# Patient Record
Sex: Female | Born: 1970
Health system: Southern US, Community
[De-identification: ages and names within clinical notes are randomized; demographics above are authoritative.]

## PROBLEM LIST (undated history)

## (undated) DIAGNOSIS — F32A Depression, unspecified: Secondary | ICD-10-CM

## (undated) DIAGNOSIS — Z5189 Encounter for other specified aftercare: Secondary | ICD-10-CM

## (undated) DIAGNOSIS — E282 Polycystic ovarian syndrome: Secondary | ICD-10-CM

## (undated) HISTORY — DX: Depression, unspecified: F32.A

## (undated) HISTORY — DX: Polycystic ovarian syndrome: E28.2

## (undated) HISTORY — PX: BREAST BIOPSY: SHX20

## (undated) HISTORY — DX: Encounter for other specified aftercare: Z51.89

## (undated) HISTORY — PX: POLYPECTOMY: SHX149

## (undated) HISTORY — PX: COLONOSCOPY: SHX174

## (undated) HISTORY — PX: OTHER SURGICAL HISTORY: SHX169

---

## 2001-03-05 ENCOUNTER — Other Ambulatory Visit: Admission: RE | Admit: 2001-03-05 | Discharge: 2001-03-05 | Payer: Self-pay | Admitting: Obstetrics and Gynecology

## 2002-04-05 ENCOUNTER — Encounter (INDEPENDENT_AMBULATORY_CARE_PROVIDER_SITE_OTHER): Payer: Self-pay | Admitting: Specialist

## 2002-04-05 ENCOUNTER — Encounter: Payer: Self-pay | Admitting: Family Medicine

## 2002-04-05 ENCOUNTER — Encounter: Admission: RE | Admit: 2002-04-05 | Discharge: 2002-04-05 | Payer: Self-pay | Admitting: Family Medicine

## 2003-10-27 ENCOUNTER — Ambulatory Visit (HOSPITAL_COMMUNITY): Admission: RE | Admit: 2003-10-27 | Discharge: 2003-10-27 | Payer: Self-pay | Admitting: Family Medicine

## 2008-03-13 ENCOUNTER — Other Ambulatory Visit: Admission: RE | Admit: 2008-03-13 | Discharge: 2008-03-13 | Payer: Self-pay | Admitting: Family Medicine

## 2011-12-19 ENCOUNTER — Other Ambulatory Visit: Payer: Self-pay | Admitting: Family Medicine

## 2011-12-19 DIAGNOSIS — Z1231 Encounter for screening mammogram for malignant neoplasm of breast: Secondary | ICD-10-CM

## 2012-01-28 ENCOUNTER — Ambulatory Visit: Payer: Self-pay

## 2012-03-04 ENCOUNTER — Ambulatory Visit
Admission: RE | Admit: 2012-03-04 | Discharge: 2012-03-04 | Disposition: A | Discharge status: Home / Self Care | Payer: Commercial Managed Care - PPO | Source: Ambulatory Visit | Attending: Family Medicine | Admitting: Family Medicine

## 2012-03-04 DIAGNOSIS — Z1231 Encounter for screening mammogram for malignant neoplasm of breast: Secondary | ICD-10-CM

## 2012-03-08 ENCOUNTER — Other Ambulatory Visit: Payer: Self-pay | Admitting: Family Medicine

## 2012-03-08 DIAGNOSIS — R928 Other abnormal and inconclusive findings on diagnostic imaging of breast: Secondary | ICD-10-CM

## 2012-04-01 ENCOUNTER — Other Ambulatory Visit: Payer: Self-pay | Admitting: Family Medicine

## 2012-04-01 ENCOUNTER — Other Ambulatory Visit (HOSPITAL_COMMUNITY)
Admission: RE | Admit: 2012-04-01 | Discharge: 2012-04-01 | Disposition: A | Discharge status: Home / Self Care | Payer: Commercial Managed Care - PPO | Source: Ambulatory Visit | Attending: Family Medicine | Admitting: Family Medicine

## 2012-04-01 DIAGNOSIS — Z124 Encounter for screening for malignant neoplasm of cervix: Secondary | ICD-10-CM | POA: Insufficient documentation

## 2012-04-01 DIAGNOSIS — Z1151 Encounter for screening for human papillomavirus (HPV): Secondary | ICD-10-CM | POA: Insufficient documentation

## 2012-04-08 ENCOUNTER — Ambulatory Visit
Admission: RE | Admit: 2012-04-08 | Discharge: 2012-04-08 | Disposition: A | Discharge status: Home / Self Care | Payer: Commercial Managed Care - PPO | Source: Ambulatory Visit | Attending: Family Medicine | Admitting: Family Medicine

## 2012-04-08 DIAGNOSIS — R928 Other abnormal and inconclusive findings on diagnostic imaging of breast: Secondary | ICD-10-CM

## 2012-09-06 ENCOUNTER — Other Ambulatory Visit: Payer: Self-pay | Admitting: Family Medicine

## 2012-09-06 DIAGNOSIS — R921 Mammographic calcification found on diagnostic imaging of breast: Secondary | ICD-10-CM

## 2012-11-09 ENCOUNTER — Ambulatory Visit
Admission: RE | Admit: 2012-11-09 | Discharge: 2012-11-09 | Disposition: A | Discharge status: Home / Self Care | Payer: Self-pay | Source: Ambulatory Visit | Attending: Family Medicine | Admitting: Family Medicine

## 2012-11-09 DIAGNOSIS — R921 Mammographic calcification found on diagnostic imaging of breast: Secondary | ICD-10-CM

## 2012-11-23 ENCOUNTER — Other Ambulatory Visit: Payer: Self-pay | Admitting: Family Medicine

## 2012-11-23 DIAGNOSIS — R921 Mammographic calcification found on diagnostic imaging of breast: Secondary | ICD-10-CM

## 2013-05-20 ENCOUNTER — Ambulatory Visit
Admission: RE | Admit: 2013-05-20 | Discharge: 2013-05-20 | Disposition: A | Discharge status: Home / Self Care | Payer: Self-pay | Source: Ambulatory Visit | Attending: Family Medicine | Admitting: Family Medicine

## 2013-05-20 DIAGNOSIS — R921 Mammographic calcification found on diagnostic imaging of breast: Secondary | ICD-10-CM

## 2014-04-17 ENCOUNTER — Other Ambulatory Visit: Payer: Self-pay

## 2014-04-17 DIAGNOSIS — Z1231 Encounter for screening mammogram for malignant neoplasm of breast: Secondary | ICD-10-CM

## 2014-05-23 ENCOUNTER — Other Ambulatory Visit: Payer: Self-pay | Admitting: Family Medicine

## 2014-05-23 DIAGNOSIS — R921 Mammographic calcification found on diagnostic imaging of breast: Secondary | ICD-10-CM

## 2014-05-26 ENCOUNTER — Ambulatory Visit
Admission: RE | Admit: 2014-05-26 | Discharge: 2014-05-26 | Disposition: A | Discharge status: Home / Self Care | Payer: BLUE CROSS/BLUE SHIELD | Source: Ambulatory Visit

## 2014-05-26 DIAGNOSIS — R921 Mammographic calcification found on diagnostic imaging of breast: Secondary | ICD-10-CM

## 2015-04-23 ENCOUNTER — Other Ambulatory Visit: Payer: Self-pay

## 2015-04-23 DIAGNOSIS — Z1231 Encounter for screening mammogram for malignant neoplasm of breast: Secondary | ICD-10-CM

## 2015-05-28 ENCOUNTER — Ambulatory Visit
Admission: RE | Admit: 2015-05-28 | Discharge: 2015-05-28 | Disposition: A | Discharge status: Home / Self Care | Payer: BLUE CROSS/BLUE SHIELD | Source: Ambulatory Visit

## 2015-05-28 ENCOUNTER — Ambulatory Visit: Payer: BLUE CROSS/BLUE SHIELD

## 2015-05-28 DIAGNOSIS — Z1231 Encounter for screening mammogram for malignant neoplasm of breast: Secondary | ICD-10-CM

## 2015-06-04 ENCOUNTER — Other Ambulatory Visit: Payer: Self-pay

## 2015-06-04 DIAGNOSIS — Z1231 Encounter for screening mammogram for malignant neoplasm of breast: Secondary | ICD-10-CM

## 2016-05-28 ENCOUNTER — Ambulatory Visit: Payer: BLUE CROSS/BLUE SHIELD

## 2016-06-25 ENCOUNTER — Ambulatory Visit: Payer: BLUE CROSS/BLUE SHIELD

## 2016-07-17 ENCOUNTER — Ambulatory Visit: Payer: BLUE CROSS/BLUE SHIELD

## 2016-07-18 ENCOUNTER — Ambulatory Visit
Admission: RE | Admit: 2016-07-18 | Discharge: 2016-07-18 | Disposition: A | Discharge status: Home / Self Care | Payer: BLUE CROSS/BLUE SHIELD | Source: Ambulatory Visit

## 2016-07-18 DIAGNOSIS — Z1231 Encounter for screening mammogram for malignant neoplasm of breast: Secondary | ICD-10-CM

## 2016-10-01 ENCOUNTER — Other Ambulatory Visit (HOSPITAL_COMMUNITY)
Admission: RE | Admit: 2016-10-01 | Discharge: 2016-10-01 | Disposition: A | Discharge status: Home / Self Care | Payer: BLUE CROSS/BLUE SHIELD | Source: Ambulatory Visit | Attending: Family Medicine | Admitting: Family Medicine

## 2016-10-01 ENCOUNTER — Other Ambulatory Visit: Payer: Self-pay | Admitting: Family Medicine

## 2016-10-01 DIAGNOSIS — Z124 Encounter for screening for malignant neoplasm of cervix: Secondary | ICD-10-CM | POA: Insufficient documentation

## 2016-10-02 LAB — CYTOLOGY - PAP
Adequacy: ABSENT
Diagnosis: NEGATIVE
HPV: NOT DETECTED

## 2017-04-08 ENCOUNTER — Ambulatory Visit (INDEPENDENT_AMBULATORY_CARE_PROVIDER_SITE_OTHER): Payer: BLUE CROSS/BLUE SHIELD | Admitting: Orthopedic Surgery

## 2017-04-08 ENCOUNTER — Encounter (INDEPENDENT_AMBULATORY_CARE_PROVIDER_SITE_OTHER): Payer: Self-pay | Admitting: Orthopedic Surgery

## 2017-04-08 DIAGNOSIS — M542 Cervicalgia: Secondary | ICD-10-CM | POA: Diagnosis not present

## 2017-04-08 NOTE — Progress Notes (Signed)
   Office Visit Note   Patient: Desiree Bennett           Date of Birth: 10/19/1970           MRN: [id number] Visit Date: 04/08/2017 Requested by: No referring provider defined for this encounter. PCP: Laurann MontanaWhite, Cynthia, MD  Subjective: Chief Complaint  Patient presents with  . neck    HPI: Desiree GrossMarjorie is a patient with neck and shoulder issues on the right-hand side.  This started Monday, 03/30/2017.  Symptoms started and were noticed when she was walking.  Basically she was listing to the right.  Her mother noticed that she was tilted and that her shoulders were "off".  She was taking a medication called Lamictal which she has since stopped and her symptoms have improved.  She denies any numbness numbness tingling or neck pain or spasm or any other type of TIA symptoms.  She denies any other medical problems.              ROS: All systems reviewed are negative as they relate to the chief complaint within the history of present illness.  Patient denies  fevers or chills.   Assessment & Plan: Visit Diagnoses:  1. Cervicalgia     Plan: Impression is atypical neck and shoulder spasm.  I think this could be a reaction to the Lamictal.  Alternatively this could represent some type of CNS process such as a TIA which is affecting her balance.  She is improving off the Lamictal.  If she is still listing by Monday I recommend that she see either her primary care provider or a neurologist  Follow-Up Instructions: Return if symptoms worsen or fail to improve.   Orders:  No orders of the defined types were placed in this encounter.  No orders of the defined types were placed in this encounter.     Procedures: No procedures performed   Clinical Data: No additional findings.  Objective: Vital Signs: There were no vitals taken for this visit.  Physical Exam:   Constitutional: Patient appears well-developed HEENT:  Head: Normocephalic Eyes:EOM are normal Neck: Normal range of  motion Cardiovascular: Normal rate Pulmonary/chest: Effort normal Neurologic: Patient is alert Skin: Skin is warm Psychiatric: Patient has normal mood and affect    Ortho Exam: Orthopedic exam demonstrates good cervical spine range of motion.  5 out of 5 grip EPL FPL interosseous wrist flexion extension biceps triceps and deltoid strength.  Patient has palpable radial pulses.  No paresthesias C5-T1.  No scoliosis on forward bending.  No real pain with neck range of motion.  Shoulder exam is normal bilaterally.  No trapezial spasm.  Patient is tilting a little bit to the right when she stands and walks.  Again this is subtle but I think it is present.  Specialty Comments:  No specialty comments available.  Imaging: No results found.   PMFS History: There are no active problems to display for this patient.  History reviewed. No pertinent past medical history.  History reviewed. No pertinent family history.  Past Surgical History:  Procedure Laterality Date  . BREAST BIOPSY     Social History   Occupational History  . Not on file  Tobacco Use  . Smoking status: Not on file  Substance and Sexual Activity  . Alcohol use: Not on file  . Drug use: Not on file  . Sexual activity: Not on file

## 2017-05-20 ENCOUNTER — Other Ambulatory Visit: Payer: Self-pay | Admitting: Family Medicine

## 2017-05-20 ENCOUNTER — Ambulatory Visit
Admission: RE | Admit: 2017-05-20 | Discharge: 2017-05-20 | Disposition: A | Discharge status: Home / Self Care | Payer: BLUE CROSS/BLUE SHIELD | Source: Ambulatory Visit | Attending: Family Medicine | Admitting: Family Medicine

## 2017-05-20 DIAGNOSIS — M4120 Other idiopathic scoliosis, site unspecified: Secondary | ICD-10-CM

## 2017-05-28 ENCOUNTER — Other Ambulatory Visit: Payer: Self-pay | Admitting: Family Medicine

## 2017-05-28 DIAGNOSIS — N912 Amenorrhea, unspecified: Secondary | ICD-10-CM

## 2017-05-28 DIAGNOSIS — E282 Polycystic ovarian syndrome: Secondary | ICD-10-CM

## 2017-06-10 ENCOUNTER — Other Ambulatory Visit: Payer: Self-pay | Admitting: Family Medicine

## 2017-06-10 DIAGNOSIS — Z1231 Encounter for screening mammogram for malignant neoplasm of breast: Secondary | ICD-10-CM

## 2017-07-20 ENCOUNTER — Ambulatory Visit
Admission: RE | Admit: 2017-07-20 | Discharge: 2017-07-20 | Disposition: A | Discharge status: Home / Self Care | Payer: BLUE CROSS/BLUE SHIELD | Source: Ambulatory Visit | Attending: Family Medicine | Admitting: Family Medicine

## 2017-07-20 DIAGNOSIS — Z1231 Encounter for screening mammogram for malignant neoplasm of breast: Secondary | ICD-10-CM

## 2019-04-22 ENCOUNTER — Other Ambulatory Visit: Payer: Self-pay | Admitting: Family Medicine

## 2019-04-22 DIAGNOSIS — E559 Vitamin D deficiency, unspecified: Secondary | ICD-10-CM

## 2019-05-17 ENCOUNTER — Other Ambulatory Visit: Payer: Self-pay

## 2019-05-17 ENCOUNTER — Ambulatory Visit
Admission: RE | Admit: 2019-05-17 | Discharge: 2019-05-17 | Disposition: A | Discharge status: Home / Self Care | Payer: BLUE CROSS/BLUE SHIELD | Source: Ambulatory Visit | Attending: Family Medicine | Admitting: Family Medicine

## 2019-05-17 DIAGNOSIS — E559 Vitamin D deficiency, unspecified: Secondary | ICD-10-CM

## 2019-08-17 DIAGNOSIS — Z86010 Personal history of colonic polyps: Secondary | ICD-10-CM

## 2019-08-17 DIAGNOSIS — Z860101 Personal history of adenomatous and serrated colon polyps: Secondary | ICD-10-CM

## 2019-08-17 HISTORY — DX: Personal history of colonic polyps: Z86.010

## 2019-08-17 HISTORY — DX: Personal history of adenomatous and serrated colon polyps: Z86.0101

## 2020-03-06 ENCOUNTER — Ambulatory Visit: Payer: BLUE CROSS/BLUE SHIELD | Admitting: Family Medicine

## 2020-03-27 ENCOUNTER — Other Ambulatory Visit: Payer: Self-pay | Admitting: Family Medicine

## 2020-03-27 DIAGNOSIS — Z1231 Encounter for screening mammogram for malignant neoplasm of breast: Secondary | ICD-10-CM

## 2020-04-12 ENCOUNTER — Telehealth: Payer: Self-pay | Admitting: Family Medicine

## 2020-04-12 NOTE — Telephone Encounter (Signed)
Please advise 

## 2020-04-12 NOTE — Telephone Encounter (Signed)
Appt moved up, Thank you!

## 2020-04-12 NOTE — Telephone Encounter (Signed)
Pt had a appt on 04/13/20 @ 2:40 but she was a new pt establishing PCP so I had to move her but there wasn't an appt until 05/01/20. Dr. Prince Rome had a slot open @ 1:20 on 04/25/20 and I was just wondering if the pt could be squeezed in that day or will she have to wait until the 22nd?

## 2020-04-12 NOTE — Telephone Encounter (Signed)
That would be ok.

## 2020-04-13 ENCOUNTER — Ambulatory Visit: Payer: BLUE CROSS/BLUE SHIELD | Admitting: Family Medicine

## 2020-04-25 ENCOUNTER — Ambulatory Visit: Payer: BLUE CROSS/BLUE SHIELD | Admitting: Family Medicine

## 2020-04-25 ENCOUNTER — Ambulatory Visit (INDEPENDENT_AMBULATORY_CARE_PROVIDER_SITE_OTHER): Payer: BLUE CROSS/BLUE SHIELD | Admitting: Family Medicine

## 2020-04-25 ENCOUNTER — Encounter: Payer: Self-pay | Admitting: Family Medicine

## 2020-04-25 ENCOUNTER — Other Ambulatory Visit: Payer: Self-pay

## 2020-04-25 VITALS — BP 96/63 | HR 66 | Ht 63.0 in | Wt 113.2 lb

## 2020-04-25 DIAGNOSIS — F319 Bipolar disorder, unspecified: Secondary | ICD-10-CM | POA: Insufficient documentation

## 2020-04-25 DIAGNOSIS — E282 Polycystic ovarian syndrome: Secondary | ICD-10-CM

## 2020-04-25 DIAGNOSIS — R944 Abnormal results of kidney function studies: Secondary | ICD-10-CM

## 2020-04-25 DIAGNOSIS — F3176 Bipolar disorder, in full remission, most recent episode depressed: Secondary | ICD-10-CM

## 2020-04-25 DIAGNOSIS — Z7689 Persons encountering health services in other specified circumstances: Secondary | ICD-10-CM

## 2020-04-25 MED ORDER — METFORMIN HCL ER 500 MG PO TB24: 2000.0000 mg | ORAL_TABLET | Freq: Every day | ORAL | 3 refills | Status: AC

## 2020-04-25 NOTE — Progress Notes (Signed)
Office Visit Note   Patient: Desiree Bennett           Date of Birth: [birth year]           MRN: [id number] Visit Date: 04/25/2020 Requested by: Laurann Montana, MD [phone] [address] [place],  [place] [address] PCP: Lavada Mesi, MD  Subjective: Chief Complaint  Patient presents with  . Other    Establish primary care    HPI: She is here to establish care.  Her parents are patients of mine.  Today she is feeling well, no concerns or complaints.  She has a history of bipolar disorder with depression, managed by Dr. Evelene Croon.  She is doing very well on her current regimen of Zoloft, Zyprexa, lithium, Lamictal, Depakote and levothyroxine.  She takes levothyroxine for depression, not for thyroid dysfunction.  She has not had any side effects with her medications.  She takes Metformin for polycystic ovary syndrome.  She states that she recently had labs by Dr. Cliffton Asters which showed decreased GFR.  She is supposed to have that rechecked in May.  She is up-to-date on gynecologic exams, she has a mammogram scheduled for next month.  She had a colonoscopy a couple years ago which showed some polyps.  She is supposed to have another one next year.  She is up-to-date on eye exams, dental exams.  She has never had any skin problems.               ROS:   All other systems were reviewed and are negative.  Objective: Vital Signs: BP 96/63   Pulse 66   Ht 5\' 3"  (1.6 m)   Wt 113 lb 3.2 oz (51.3 kg)   BMI 20.05 kg/m   Physical Exam:  General:  Alert and oriented, in no acute distress. Pulm:  Breathing unlabored. Psy:  Normal mood, congruent affect. Skin: No suspicious lesions seen HEENT:  [place]/AT, PERRLA, EOM Full, no nystagmus.  Funduscopic examination within normal limits.  No conjunctival erythema.  Tympanic membranes are pearly gray with normal landmarks.  External ear canals are normal.  Nasal passages are clear.  Oropharynx is clear.  No significant lymphadenopathy.  No  thyromegaly or nodules.  2+ carotid pulses without bruits. CV: Regular rate and rhythm without murmurs, rubs, or gallops.  No peripheral edema.  2+ radial and posterior tibial pulses. Lungs: Clear to auscultation throughout with no wheezing or areas of consolidation. Abd: Bowel sounds are active, no hepatosplenomegaly or masses.  Soft and nontender.  No audible bruits.  No evidence of ascites.   Imaging: No results found.  Assessment & Plan: 1.  Visit to establish care -We will see her back in a year for wellness exam.  2.  Bipolar disorder, currently doing well.  Managed by psychiatry.  3.  PCOS -Refilled Metformin.  4.  Decreased GFR -Recheck in May.     Procedures: No procedures performed        PMFS History: Patient Active Problem List   Diagnosis Date Noted  . Bipolar disorder (HCC) 04/25/2020  . PCOS (polycystic ovarian syndrome) 04/25/2020   History reviewed. No pertinent past medical history.  Family History  Problem Relation Age of Onset  . Depression Sister   . Cancer Paternal Aunt   . Deep vein thrombosis Paternal Grandmother   . COPD Paternal Grandfather   . Diabetes Paternal Aunt   . Heart attack Neg Hx   . Stroke Neg Hx     Past Surgical  History:  Procedure Laterality Date  . BREAST BIOPSY Left    Social History   Occupational History  . Not on file  Tobacco Use  . Smoking status: Never Smoker  . Smokeless tobacco: Never Used  Substance and Sexual Activity  . Alcohol use: Not on file  . Drug use: Not on file  . Sexual activity: Not on file

## 2020-05-01 ENCOUNTER — Ambulatory Visit: Payer: BLUE CROSS/BLUE SHIELD | Admitting: Family Medicine

## 2020-05-03 ENCOUNTER — Telehealth: Payer: Self-pay | Admitting: Family Medicine

## 2020-05-03 NOTE — Telephone Encounter (Signed)
I called and left this information on the patient's voice mail.   

## 2020-05-03 NOTE — Telephone Encounter (Signed)
Pt called stating she has some lab work coming up and wanted to know if the lab work would be for her kidneys? Pt also stated she is taking metformin and wanted to know if she has to take it with food and does she have to take it at a certain time? Pt would like a CB with answers  [phone]

## 2020-05-03 NOTE — Telephone Encounter (Signed)
It does not have to be with food, and the time of day probably doesn't matter either.

## 2020-05-03 NOTE — Telephone Encounter (Signed)
I know the answer to the first part is yes. Please advise on the metformin.

## 2020-05-10 ENCOUNTER — Ambulatory Visit: Payer: BLUE CROSS/BLUE SHIELD | Admitting: Family Medicine

## 2020-05-16 ENCOUNTER — Telehealth: Payer: Self-pay | Admitting: Family Medicine

## 2020-05-16 NOTE — Telephone Encounter (Signed)
Pt called wanting to know if she should get a mammogram? Pt states her last one was in April of 2021;she would like a CB to be advised.   [phone]

## 2020-05-16 NOTE — Telephone Encounter (Signed)
I called and reached the patient's voice mail - it looks like the patient cancelled her mammogram for 05/18/20. Dr. Prince Rome ordered it back in February. Asked her to call me back or send a MyChart message.

## 2020-05-17 ENCOUNTER — Telehealth: Payer: Self-pay | Admitting: Family Medicine

## 2020-05-17 NOTE — Telephone Encounter (Signed)
I sent a message to the patient through MyChart - please refer to that message.

## 2020-05-17 NOTE — Telephone Encounter (Signed)
Patient needs a call back. Patient states that Dr. Donzetta Sprung is her PCP and was she can schedule her yearly mammogram. Also need referral for mammogram under [facility]. Please call patient at [phone]. Patient states she does not want to go to [facility].

## 2020-05-17 NOTE — Telephone Encounter (Signed)
I messaged you about this patient earlier. She originally had a mammogram scheduled for 05/18/20, but she cancelled it because she wanted a [facility] facility - she does not want [facility].

## 2020-05-18 ENCOUNTER — Ambulatory Visit: Payer: BLUE CROSS/BLUE SHIELD

## 2020-05-30 NOTE — Telephone Encounter (Signed)
[facility]

## 2020-06-08 ENCOUNTER — Ambulatory Visit (INDEPENDENT_AMBULATORY_CARE_PROVIDER_SITE_OTHER): Payer: BLUE CROSS/BLUE SHIELD | Admitting: Family Medicine

## 2020-06-08 ENCOUNTER — Encounter: Payer: Self-pay | Admitting: Family Medicine

## 2020-06-08 ENCOUNTER — Other Ambulatory Visit: Payer: Self-pay

## 2020-06-08 VITALS — BP 99/62 | HR 76 | Ht 63.0 in | Wt 116.4 lb

## 2020-06-08 DIAGNOSIS — N912 Amenorrhea, unspecified: Secondary | ICD-10-CM | POA: Insufficient documentation

## 2020-06-08 DIAGNOSIS — H9190 Unspecified hearing loss, unspecified ear: Secondary | ICD-10-CM | POA: Insufficient documentation

## 2020-06-08 DIAGNOSIS — Z86010 Personal history of colonic polyps: Secondary | ICD-10-CM

## 2020-06-08 DIAGNOSIS — E039 Hypothyroidism, unspecified: Secondary | ICD-10-CM | POA: Insufficient documentation

## 2020-06-08 DIAGNOSIS — M4120 Other idiopathic scoliosis, site unspecified: Secondary | ICD-10-CM | POA: Insufficient documentation

## 2020-06-08 NOTE — Progress Notes (Signed)
   Office Visit Note   Patient: Desiree Bennett           Date of Birth: 05/15/1970           MRN: [id number] Visit Date: 06/08/2020 Requested by: Lavada Mesi, MD [address] [place],  [place] [address] PCP: Lavada Mesi, MD  Subjective: Chief Complaint  Patient presents with  . Other    Questions about having a colonoscopy this year. She brought the results from last year, that was done through [facility] -- wants to stay in the [facility] system. Also, she has some covid questions.    HPI: She is here to discuss GI referral.  Last year she had colonoscopy showing multiple polyps.  She was told she needed another one in 1 year.  This was at [facility].  She would like to transfer to the [facility] system.  She also had covid-related questions.  She has been vaccinated and boosted.  No covid illness so far.               ROS:   All other systems were reviewed and are negative.  Objective: Vital Signs: BP 99/62   Pulse 76   Ht 5\' 3"  (1.6 m)   Wt 116 lb 6.4 oz (52.8 kg)   BMI 20.62 kg/m   Physical Exam:  General:  Alert and oriented, in no acute distress. Pulm:  Breathing unlabored. Psy:  Normal mood, congruent affect.  No exam done.  Imaging: No results found.  Assessment & Plan: 1.  History of colon polyps. - Referral to Dr. .       Procedures: No procedures performed        PMFS History: Patient Active Problem List   Diagnosis Date Noted  . Amenorrhea 06/08/2020  . Hearing loss 06/08/2020  . Hypothyroidism 06/08/2020  . Other idiopathic scoliosis, site unspecified 06/08/2020  . Bipolar disorder (HCC) 04/25/2020  . PCOS (polycystic ovarian syndrome) 04/25/2020   No past medical history on file.  Family History  Problem Relation Age of Onset  . Depression Sister   . Cancer Paternal Aunt   . Deep vein thrombosis Paternal Grandmother   . COPD Paternal Grandfather   . Diabetes Paternal Aunt   . Heart attack Neg Hx   . Stroke Neg Hx     Past Surgical  History:  Procedure Laterality Date  . BREAST BIOPSY Left    Social History   Occupational History  . Not on file  Tobacco Use  . Smoking status: Never Smoker  . Smokeless tobacco: Never Used  Substance and Sexual Activity  . Alcohol use: Not on file  . Drug use: Not on file  . Sexual activity: Not on file

## 2020-06-13 ENCOUNTER — Telehealth: Payer: Self-pay | Admitting: Internal Medicine

## 2020-06-13 DIAGNOSIS — Z86010 Personal history of colonic polyps: Secondary | ICD-10-CM

## 2020-06-13 NOTE — Telephone Encounter (Signed)
Good afternoon Dr. Leone Payor, we received a referral for patient to have a colonoscopy.  She had one 08/17/19 with [facility] but would like to transfer to [facility].  Records will be sent to you.  Can you please review and advise on scheduling?  Thank you.

## 2020-06-14 ENCOUNTER — Encounter: Payer: Self-pay | Admitting: Internal Medicine

## 2020-06-14 NOTE — Telephone Encounter (Signed)
Patient had colonoscopy July 2021 he had 2 adenomas removed that were some centimeter but had a fair prep so a colonoscopy was appropriately recommended at 1 year.  Please schedule direct colonoscopy.  Patient may need double prep.

## 2020-06-14 NOTE — Telephone Encounter (Signed)
Called patient and scheduled her for 08/09/20 and 08/31/20.

## 2020-06-25 ENCOUNTER — Ambulatory Visit (INDEPENDENT_AMBULATORY_CARE_PROVIDER_SITE_OTHER): Payer: BLUE CROSS/BLUE SHIELD

## 2020-06-25 ENCOUNTER — Other Ambulatory Visit: Payer: Self-pay

## 2020-06-25 ENCOUNTER — Telehealth: Payer: Self-pay

## 2020-06-25 ENCOUNTER — Encounter: Payer: Self-pay | Admitting: Family Medicine

## 2020-06-25 DIAGNOSIS — R944 Abnormal results of kidney function studies: Secondary | ICD-10-CM

## 2020-06-25 NOTE — Telephone Encounter (Signed)
It's a BMP (orders are in chart), mainly to check kidney function.  So no fasting needed.

## 2020-06-25 NOTE — Telephone Encounter (Signed)
Noted  

## 2020-06-25 NOTE — Telephone Encounter (Signed)
Pt would like to know if she needs to fast before blood work.

## 2020-06-25 NOTE — Telephone Encounter (Signed)
Please advise. Also what labs are we drawing?

## 2020-06-25 NOTE — Progress Notes (Signed)
Patient came in today for a nurse visit. We drew some blood work.

## 2020-06-26 ENCOUNTER — Telehealth: Payer: Self-pay | Admitting: Family Medicine

## 2020-06-26 LAB — BASIC METABOLIC PANEL
BUN: 19 mg/dL (ref 7–25)
CO2: 31 mmol/L (ref 20–32)
Calcium: 10.2 mg/dL (ref 8.6–10.4)
Chloride: 107 mmol/L (ref 98–110)
Creat: 1 mg/dL (ref 0.50–1.05)
Glucose, Bld: 96 mg/dL (ref 65–99)
Potassium: 5 mmol/L (ref 3.5–5.3)
Sodium: 145 mmol/L (ref 135–146)

## 2020-06-26 LAB — EXTRA LAV TOP TUBE

## 2020-06-26 NOTE — Telephone Encounter (Signed)
Kidney function/creatinine looks great.

## 2020-07-11 ENCOUNTER — Ambulatory Visit
Admission: RE | Admit: 2020-07-11 | Discharge: 2020-07-11 | Disposition: A | Discharge status: Home / Self Care | Payer: BLUE CROSS/BLUE SHIELD | Source: Ambulatory Visit | Attending: Family Medicine | Admitting: Family Medicine

## 2020-07-11 ENCOUNTER — Other Ambulatory Visit: Payer: Self-pay | Admitting: Family Medicine

## 2020-07-11 ENCOUNTER — Other Ambulatory Visit: Payer: Self-pay

## 2020-07-11 DIAGNOSIS — Z1231 Encounter for screening mammogram for malignant neoplasm of breast: Secondary | ICD-10-CM

## 2020-07-16 ENCOUNTER — Telehealth: Payer: Self-pay | Admitting: Family Medicine

## 2020-07-16 NOTE — Telephone Encounter (Signed)
Mammogram is negative/normal.   

## 2020-08-09 ENCOUNTER — Telehealth: Payer: Self-pay | Admitting: Internal Medicine

## 2020-08-09 ENCOUNTER — Ambulatory Visit (AMBULATORY_SURGERY_CENTER): Payer: BLUE CROSS/BLUE SHIELD | Admitting: *Deleted

## 2020-08-09 ENCOUNTER — Other Ambulatory Visit: Payer: Self-pay

## 2020-08-09 VITALS — Ht 63.0 in | Wt 114.0 lb

## 2020-08-09 DIAGNOSIS — Z86010 Personal history of colonic polyps: Secondary | ICD-10-CM

## 2020-08-09 MED ORDER — PEG-KCL-NACL-NASULF-NA ASC-C 100 G PO SOLR: 1.0000 | Freq: Once | ORAL | 0 refills | Status: AC

## 2020-08-09 NOTE — Telephone Encounter (Signed)
Pt states Movi is $88-  she wants a different  Less expensive pre- Changes her to @ DAyt Miralax- we discussed new prep instructions over the phone today , mailed hte new instructions and sent in her My Chart

## 2020-08-09 NOTE — Progress Notes (Addendum)
No egg or soy allergy known to patient  No issues with past sedation with any surgeries or procedures Patient denies ever being told they had issues or difficulty with intubation  No FH of Malignant Hyperthermia No diet pills per patient No home 02 use per patient  No blood thinners per patient  Pt denies issues with constipation  No A fib or A flutter  EMMI video to pt or via MyChart  COVID 19 guidelines implemented in PV today with Pt and RN  Pt is fully vaccinated  for [employer] given to pt in PV today , Code to Pharmacy and  NO PA's for preps discussed with pt In PV today  Discussed with pt there will be an out-of-pocket cost for prep and that varies from $0 to 70 dollars   Due to the COVID-19 pandemic we are asking patients to follow certain guidelines.  Pt aware of COVID protocols and [facility] guidelines  No active Patient Care Registrations exist for this patient. (The Care Teams activity is used to record Patient Care Registration information.)  Pt called back- states Movi prep is 88$ and too expensive for her- asked to be changed- Changed her to a 2 day Miralax prep - new instructions to mail andMy Chart

## 2020-08-09 NOTE — Telephone Encounter (Signed)
Patient called said the Movi prep medication sent is too expensive and is seeking an alternative.

## 2020-08-14 ENCOUNTER — Telehealth: Payer: Self-pay | Admitting: Internal Medicine

## 2020-08-14 NOTE — Telephone Encounter (Signed)
Inbound call from patient requesting a call from a nurse please.  Has additional questions in regards to her procedure.

## 2020-08-14 NOTE — Telephone Encounter (Signed)
Spoke with the patient and answered her prep questions.

## 2020-08-31 ENCOUNTER — Encounter: Payer: BLUE CROSS/BLUE SHIELD | Admitting: Internal Medicine

## 2020-10-03 ENCOUNTER — Telehealth: Payer: Self-pay | Admitting: Family Medicine

## 2020-10-03 NOTE — Telephone Encounter (Signed)
Requesting refills through January (when she has an appointment with a new PCP).

## 2020-10-03 NOTE — Telephone Encounter (Signed)
Pt called requesting a refill of metformin before Dr. Prince Rome leaves office. Please send to pharmacy on file. Pt phone number is [phone]

## 2020-10-05 ENCOUNTER — Telehealth: Payer: Self-pay | Admitting: Family Medicine

## 2020-10-05 NOTE — Telephone Encounter (Signed)
I called and reached her voice mail. Left message to call me back.

## 2020-10-05 NOTE — Telephone Encounter (Signed)
Tried calling again and got her voice mail.

## 2020-10-05 NOTE — Telephone Encounter (Signed)
Patient called. She would like for Terri to call her. [phone]

## 2021-06-05 ENCOUNTER — Other Ambulatory Visit: Payer: Self-pay | Admitting: Family Medicine

## 2021-06-05 DIAGNOSIS — Z1231 Encounter for screening mammogram for malignant neoplasm of breast: Secondary | ICD-10-CM

## 2021-07-10 ENCOUNTER — Other Ambulatory Visit: Payer: Self-pay | Admitting: Radiology

## 2023-01-28 IMAGING — MG DIGITAL SCREENING BILAT W/ CAD
4 series · 4 of 4 positions shown · non-contrast
Comparison: Previous exam(s).

CLINICAL DATA: Screening.

EXAM:
DIGITAL SCREENING BILATERAL MAMMOGRAM WITH CAD
TECHNIQUE: Bilateral screening digital craniocaudal and mediolateral oblique
mammograms were obtained. The images were evaluated with
computer-aided detection.

[L CC]
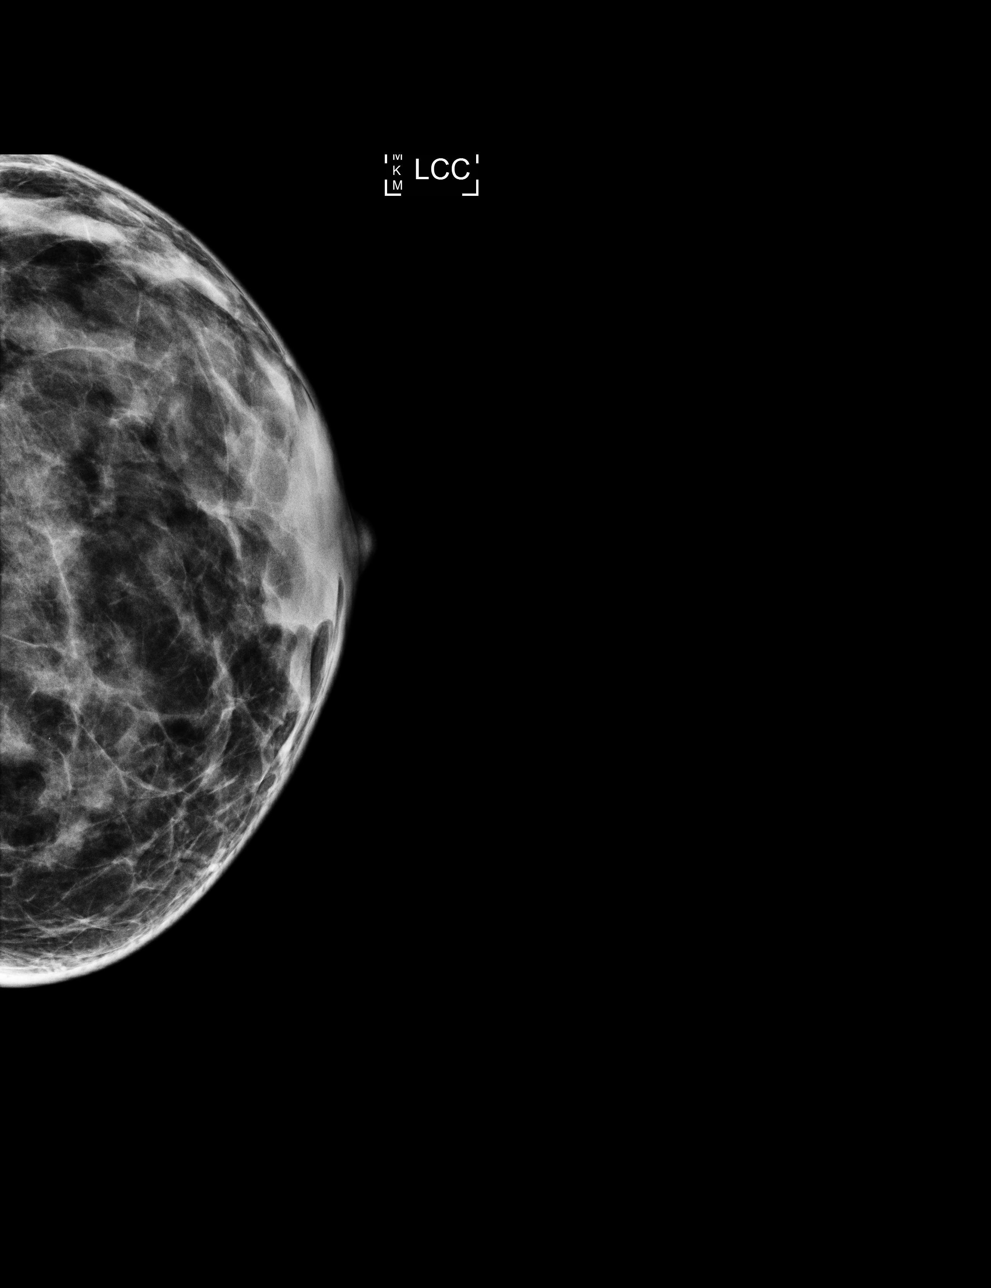

[R CC]
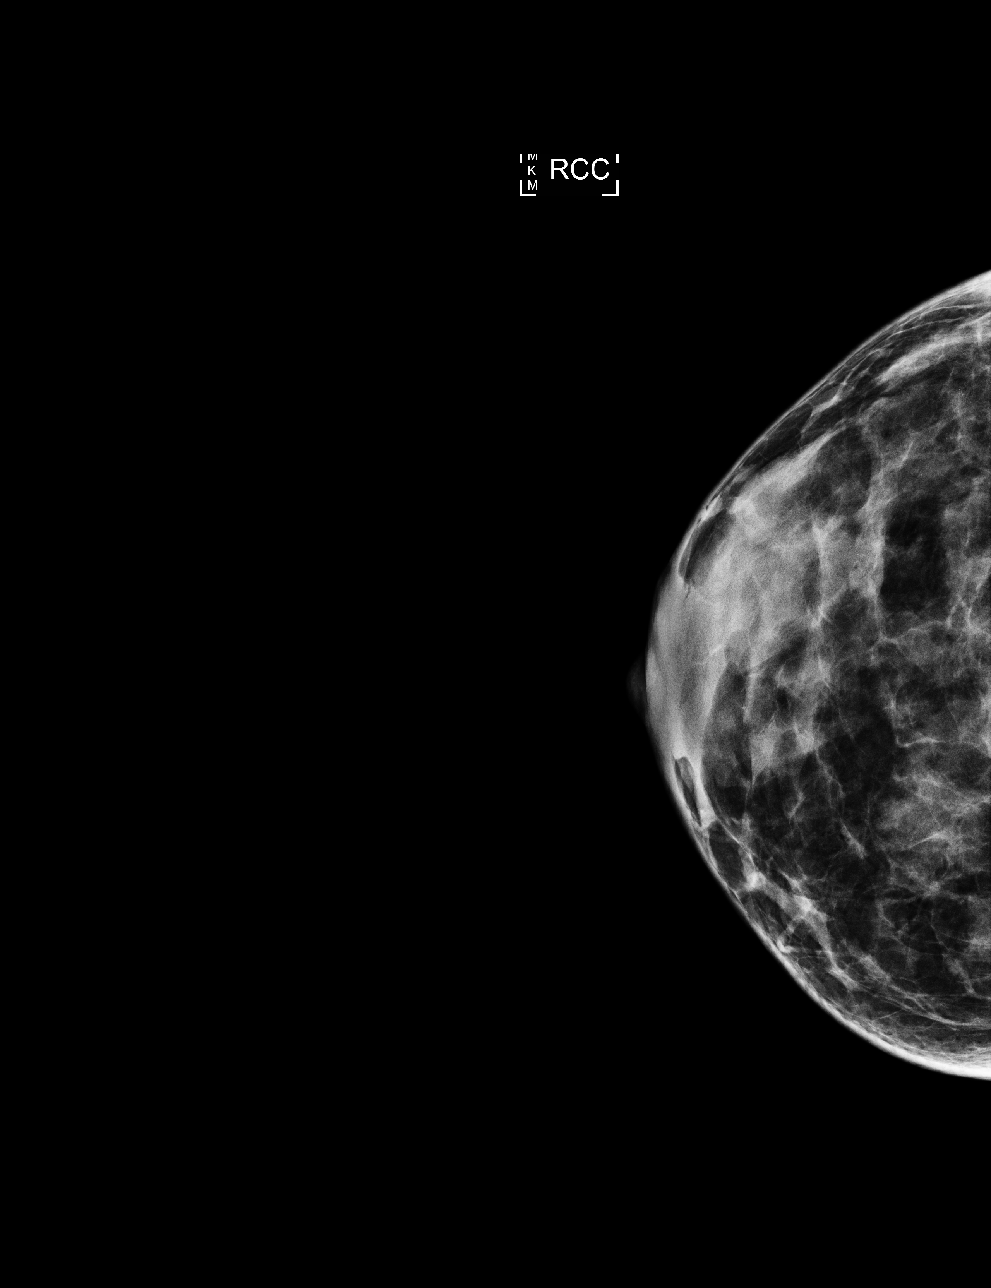

[L MLO]
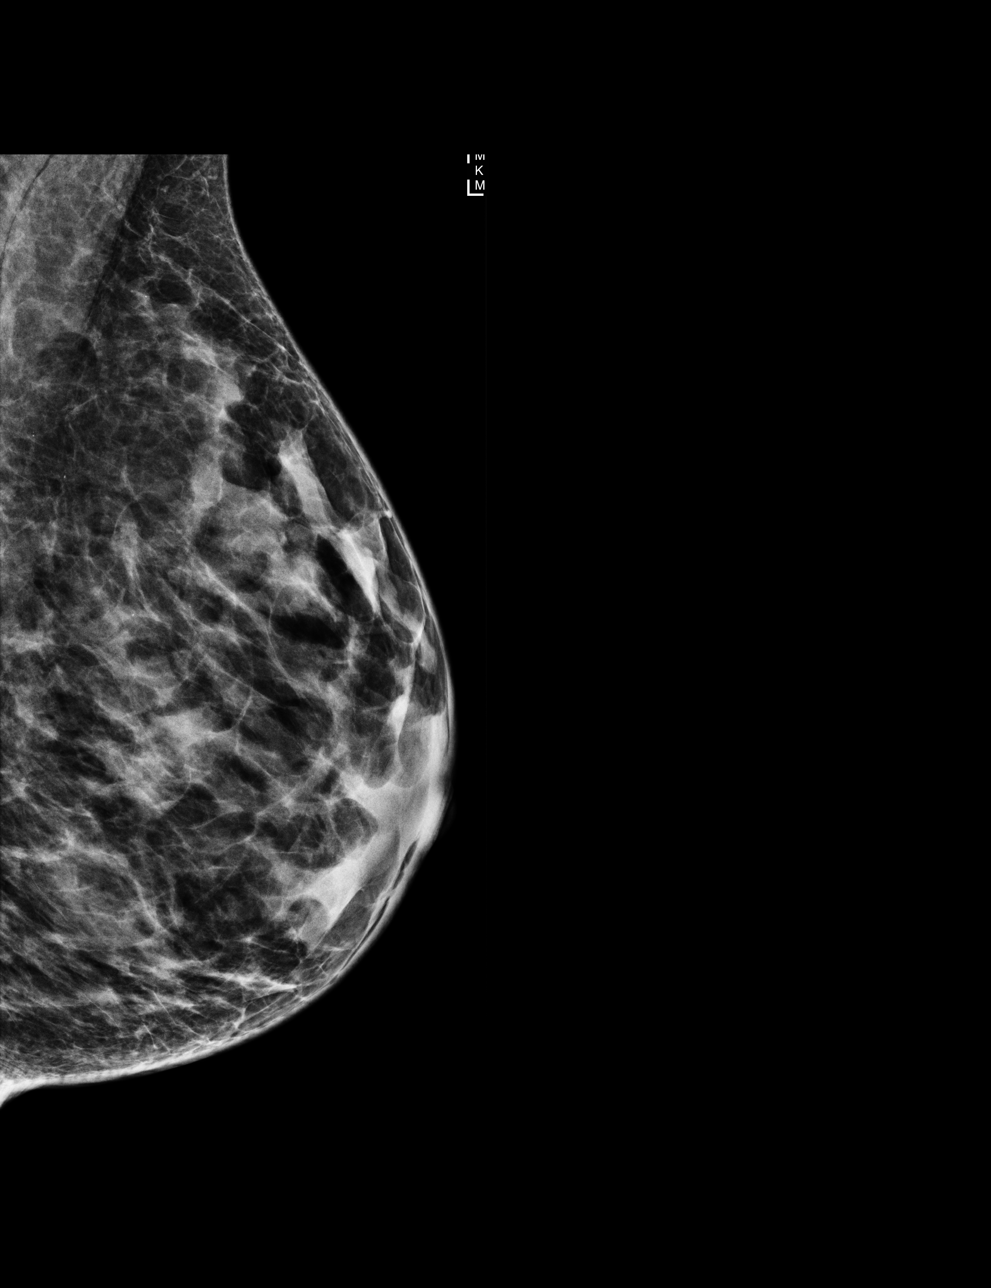

[R MLO]
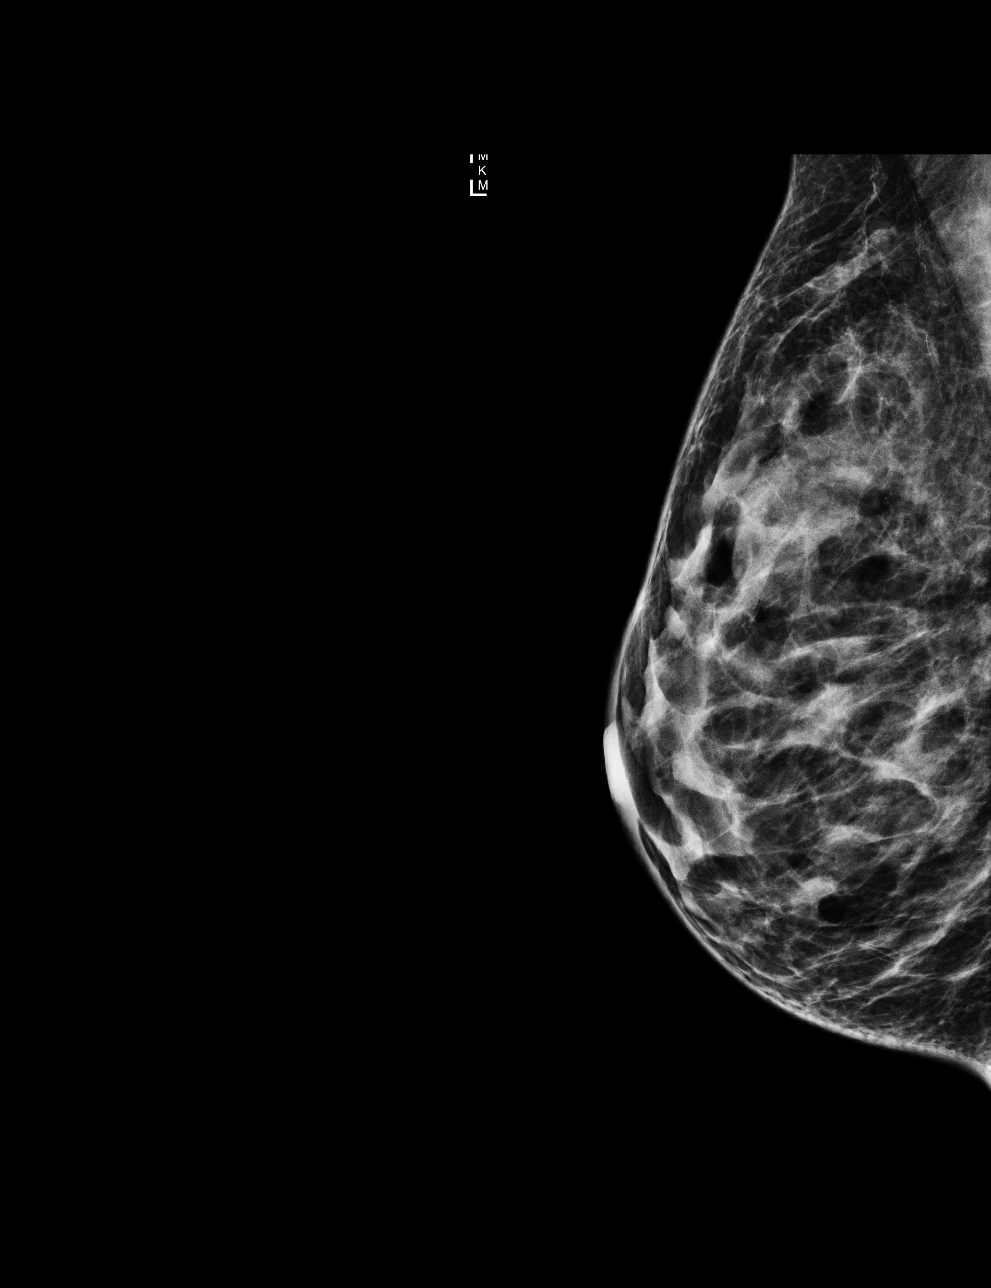

[4 of 4 positions shown; findings below may reference images not displayed]

ACR Breast Density Category c: The breast tissue is heterogeneously
dense, which may obscure small masses.
FINDINGS: There are no findings suspicious for malignancy. The images were
evaluated with computer-aided detection.
IMPRESSION: No mammographic evidence of malignancy. A result letter of this
screening mammogram will be mailed directly to the patient.

RECOMMENDATION:
Screening mammogram in one year. (Code:WI-W-Z4A)

BI-RADS CATEGORY  1: Negative.
